# Patient Record
Sex: Male | Born: 1985 | Hispanic: Yes | Marital: Single | Smoking: Current every day smoker
Health system: Southern US, Community
[De-identification: ages and names within clinical notes are randomized; demographics above are authoritative.]

## PROBLEM LIST (undated history)

## (undated) DIAGNOSIS — E785 Hyperlipidemia, unspecified: Secondary | ICD-10-CM

---

## 2014-09-15 ENCOUNTER — Encounter (HOSPITAL_COMMUNITY): Payer: Self-pay | Admitting: Emergency Medicine

## 2014-09-15 ENCOUNTER — Emergency Department (HOSPITAL_COMMUNITY)
Admission: EM | Admit: 2014-09-15 | Discharge: 2014-09-15 | Disposition: A | Discharge status: Home / Self Care | Payer: Self-pay | Attending: Emergency Medicine | Admitting: Emergency Medicine

## 2014-09-15 ENCOUNTER — Ambulatory Visit (INDEPENDENT_AMBULATORY_CARE_PROVIDER_SITE_OTHER): Payer: Self-pay | Admitting: Family Medicine

## 2014-09-15 ENCOUNTER — Emergency Department (HOSPITAL_COMMUNITY): Payer: Self-pay

## 2014-09-15 VITALS — BP 134/84 | HR 105 | Temp 99.1°F | Resp 20

## 2014-09-15 DIAGNOSIS — Z8639 Personal history of other endocrine, nutritional and metabolic disease: Secondary | ICD-10-CM | POA: Insufficient documentation

## 2014-09-15 DIAGNOSIS — R000 Tachycardia, unspecified: Secondary | ICD-10-CM

## 2014-09-15 DIAGNOSIS — R0602 Shortness of breath: Secondary | ICD-10-CM | POA: Insufficient documentation

## 2014-09-15 DIAGNOSIS — F14180 Cocaine abuse with cocaine-induced anxiety disorder: Secondary | ICD-10-CM

## 2014-09-15 DIAGNOSIS — R0789 Other chest pain: Secondary | ICD-10-CM | POA: Insufficient documentation

## 2014-09-15 DIAGNOSIS — Z720 Tobacco use: Secondary | ICD-10-CM | POA: Insufficient documentation

## 2014-09-15 DIAGNOSIS — F1410 Cocaine abuse, uncomplicated: Secondary | ICD-10-CM | POA: Insufficient documentation

## 2014-09-15 HISTORY — DX: Hyperlipidemia, unspecified: E78.5

## 2014-09-15 LAB — CBC WITH DIFFERENTIAL/PLATELET
BASOS ABS: 0 10*3/uL (ref 0.0–0.1)
Basophils Relative: 0 % (ref 0–1)
EOS ABS: 0 10*3/uL (ref 0.0–0.7)
Eosinophils Relative: 0 % (ref 0–5)
HCT: 41.2 % (ref 39.0–52.0)
HEMOGLOBIN: 14.9 g/dL (ref 13.0–17.0)
LYMPHS ABS: 1.4 10*3/uL (ref 0.7–4.0)
LYMPHS PCT: 11 % — AB (ref 12–46)
MCH: 28.2 pg (ref 26.0–34.0)
MCHC: 36.2 g/dL — ABNORMAL HIGH (ref 30.0–36.0)
MCV: 77.9 fL — AB (ref 78.0–100.0)
MONOS PCT: 10 % (ref 3–12)
Monocytes Absolute: 1.3 10*3/uL — ABNORMAL HIGH (ref 0.1–1.0)
NEUTROS PCT: 79 % — AB (ref 43–77)
Neutro Abs: 10.5 10*3/uL — ABNORMAL HIGH (ref 1.7–7.7)
PLATELETS: 245 10*3/uL (ref 150–400)
RBC: 5.29 MIL/uL (ref 4.22–5.81)
RDW: 12.3 % (ref 11.5–15.5)
WBC: 13.3 10*3/uL — AB (ref 4.0–10.5)

## 2014-09-15 LAB — I-STAT TROPONIN, ED: Troponin i, poc: 0 ng/mL (ref 0.00–0.08)

## 2014-09-15 LAB — BASIC METABOLIC PANEL
Anion gap: 9 (ref 5–15)
BUN: 16 mg/dL (ref 6–20)
CHLORIDE: 108 mmol/L (ref 101–111)
CO2: 21 mmol/L — ABNORMAL LOW (ref 22–32)
Calcium: 9.3 mg/dL (ref 8.9–10.3)
Creatinine, Ser: 0.85 mg/dL (ref 0.61–1.24)
GFR calc Af Amer: >60 mL/min (ref >60)
GLUCOSE: 103 mg/dL — AB (ref 65–99)
POTASSIUM: 3.4 mmol/L — AB (ref 3.5–5.1)
Sodium: 138 mmol/L (ref 135–145)

## 2014-09-15 MED ORDER — DIAZEPAM 5 MG/ML IJ SOLN: 10.0000 mg | Freq: Once | INTRAMUSCULAR | Status: AC

## 2014-09-15 NOTE — ED Provider Notes (Signed)
CSN: [id number]     Arrival date & time 09/15/14  1600 History   First MD Initiated Contact with Patient 09/15/14 1609     Chief Complaint  Patient presents with  . Anxiety     (Consider location/radiation/quality/duration/timing/severity/associated sxs/prior Treatment) HPI Patient is a 29 year old male with past medical history of hyperlipidemia who presents the ER after being sent from urgent care. Patient initially went to urgent care complaining of tingling sensation in his fingers and toes. Patient states he used cocaine last night around 1 AM, and did not sleep through the night. Patient states around 10 or 11 this morning he "became ill, began to notice pain in his fingers, feet, legs and arms. Patient reports associated cramping sensation of all extremities. Patient states he also noticed associated tightness in his chest, and felt as though he was having difficulty breathing. Patient received IV Valium at the [facility] urgent care, and was sent to the ER for further evaluation. During my exam patient states he is "feeling much better". Patient states he is still only experiencing some mild tingling sensation in his fingers bilaterally. Patient denies nausea, vomiting, abdominal pain, fever.   Past Medical History  Diagnosis Date  . Hyperlipemia    History reviewed. No pertinent past surgical history. No family history on file. History  Substance Use Topics  . Smoking status: Current Every Day Smoker -- 0.50 packs/day    Types: Cigarettes  . Smokeless tobacco: Not on file  . Alcohol Use: Yes     Comment: "just a little"     Review of Systems  Constitutional: Negative for fever.  HENT: Negative for trouble swallowing.   Eyes: Negative for visual disturbance.  Respiratory: Positive for chest tightness and shortness of breath.   Cardiovascular: Negative for chest pain.  Gastrointestinal: Negative for nausea, vomiting and abdominal pain.  Genitourinary: Negative for dysuria.   Musculoskeletal: Negative for neck pain.  Skin: Negative for rash.  Neurological: Negative for dizziness, weakness and numbness.  Psychiatric/Behavioral: Negative.       Allergies  Review of patient's allergies indicates no known allergies.  Home Medications   Prior to Admission medications   Not on File   BP 132/76 mmHg  Pulse 72  Temp(Src) 98 F (36.7 C) (Oral)  Resp 19  SpO2 100% Physical Exam  Constitutional: He is oriented to person, place, and time. He appears well-developed and well-nourished. No distress.  HENT:  Head: Normocephalic and atraumatic.  Mouth/Throat: Oropharynx is clear and moist. No oropharyngeal exudate.  Eyes: Right eye exhibits no discharge. Left eye exhibits no discharge. No scleral icterus.  Neck: Normal range of motion.  Cardiovascular: Normal rate, regular rhythm and normal heart sounds.   No murmur heard. Pulmonary/Chest: Effort normal and breath sounds normal. No respiratory distress.  Abdominal: Soft. There is no tenderness.  Musculoskeletal: Normal range of motion. He exhibits no edema or tenderness.  Neurological: He is alert and oriented to person, place, and time. No cranial nerve deficit. Coordination normal.  Skin: Skin is warm and dry. No rash noted. He is not diaphoretic.  Psychiatric: He has a normal mood and affect.    ED Course  Procedures (including critical care time) Labs Review Labs Reviewed  CBC WITH DIFFERENTIAL/PLATELET - Abnormal; Notable for the following:    WBC 13.3 (*)    MCV 77.9 (*)    MCHC 36.2 (*)    Neutrophils Relative % 79 (*)    Neutro Abs 10.5 (*)    Lymphocytes Relative  11 (*)    Monocytes Absolute 1.3 (*)    All other components within normal limits  BASIC METABOLIC PANEL - Abnormal; Notable for the following:    Potassium 3.4 (*)    CO2 21 (*)    Glucose, Bld 103 (*)    All other components within normal limits  I-STAT TROPOININ, ED    Imaging Review Dg Chest 2 View  09/15/2014    CLINICAL DATA:  Two day history of chest pain with mild shortness of breath  EXAM: CHEST  2 VIEW  COMPARISON:  None.  FINDINGS: Lungs are clear. Heart size and pulmonary vascularity are normal. No adenopathy. No pneumothorax. No bone lesions. Several air-fluid levels are noted in the visualized bowel gas region.  IMPRESSION: No edema or consolidation. Air-fluid levels in the upper abdominal bowel. Question underlying ileus or enteritis.   Electronically Signed   By: Bretta Bang III M.D.   On: 09/15/2014 17:55     EKG Interpretation   Date/Time:  Saturday September 15 2014 16:07:20 EDT Ventricular Rate:  78 PR Interval:  134 QRS Duration: 95 QT Interval:  371 QTC Calculation: 423 R Axis:   35 Text Interpretation:  Sinus rhythm ST elevation, consider anterolateral  injury vs early repolarization No previous tracing Confirmed by KNAPP   MD-J, JON ([id number]) on 09/15/2014 6:44:26 PM      MDM   Final diagnoses:  Chest tightness  Atypical chest pain  Cocaine abuse    Patient here with mild signs and symptoms of tingling sensation in his fingers after using cocaine last night. Patient was getting Valium at the urgent care prior to being transferred here. Patient states feeling much better after the Valium. Patient describing some chest discomfort earlier in the day today. Patient denying any shortness of breath or chest pain here. EKG shows evidence of early repolarization. This along with patient's age, risk factors and the fact the patient does not have any chest pain or shortness of breath throughout his stay here, likely benign early repolarization.. Troponin 0 which is greater than 6 hours after onset of symptoms. Patient has heart score less than 3. Chest x-ray without any evidence of injury or ectopy. Patient is PERC negative. Likely patient signs and symptoms all related to his use of cocaine. There is no evidence of end organ damage throughout ER stay here. Patient asymptomatic by the end  of his stay. Patient afebrile, hemodynamically stable and in no acute distress. Patient denies suicidal or homicidal ideation. Patient given outpatient resources, stable for discharge, strongly encouraged to follow up with PCP. Return precautions discussed.   BP 132/76 mmHg  Pulse 72  Temp(Src) 98 F (36.7 C) (Oral)  Resp 19  SpO2 100%  Signed,  Ladona Mow, PA-C 1:08 AM  Patient discussed with Dr. Linwood Dibbles, MD   Ladona Mow, PA-C 09/16/14 [id number]  Linwood Dibbles, MD 09/17/14 1230

## 2014-09-15 NOTE — ED Notes (Signed)
Per EMS pt transferred here from [facility] for evaluation of anxiety. Pt use of ETOH and cocaine at 2300 last night; given 10 mg valium IV at Urgent Care. NSR en route.

## 2014-09-15 NOTE — Patient Instructions (Signed)
EMS take patient to [facility]

## 2014-09-15 NOTE — ED Notes (Signed)
Per [facility] via translator phone pt reports cocaine and alcohol use last night "but just a little;" felt funny last night while drinking so went home; went to work today "felt weird and forgetting things there" so went to doctor to be seen. Pt denies pain at present time and confirms was sent from urgent care to be evaluated for his anxiety.

## 2014-09-15 NOTE — ED Notes (Signed)
Bed: LX72 Expected date:  Expected time:  Means of arrival:  Comments: EMS/65M/cocaine and etoh use

## 2014-09-15 NOTE — Progress Notes (Signed)
  Subjective:  Patient ID: George Wolfe, male    DOB: 12-19-1985  Age: 29 y.o. MRN: [id number]  29 year old male, Hispanic, so does not speak hardly any [place]. He was brought in here by his friends. Last night the patient used cocaine at about 11 PM. He had a few beers. He was very agitated and his friends brought him here. He is nervous and complains of pain and spasm in both his arms and both of his legs. He has had a little bit of chest pain. No nausea or vomiting. He decided today except for he had some milk. Eyes he has been healthy.   Objective:   Agitated tense look on his face. Flushed with reddened face and extremities. Warm to touch in his extremities. Temperature was only 99.2. He speaks [place]. His friends interpreted. He denies major problems other than hurting in his arms and legs. When asked about chest pain he said he had a small amount. He is alert. Eyes PERRLA. Conjunctivae injected. Throat clear. Neck supple without nodes. Chest clear to auscultation. Heart regular without murmurs. Abdomen soft. Extremities are being held fairly rigid IM.  Assessment & Plan:   Assessment: Cocaine abuse with acute cocaine-induced anxiety Arm and leg pain Mild chest pain  Plan: IV was begun on the patient and he was given 5 mg of IV Valium, repeated in about 4 minutes. Total of 10 mg IV push Valium. EMS arrived at about that time and they will take the patient on to the emergency room at [facility] for further management.   Patient Instructions  EMS take patient to [facility]     Georgiann Neider, MD 09/15/2014

## 2014-09-15 NOTE — Discharge Instructions (Signed)
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 La cocana tiene usos mdicos, como Photographer sangrado nasal y Human resources officer antes de una ciruga menor odontolgica o nasal. Sin embargo, tambin se [place] con fines indebidos por los [employer] produce. Estos efectos incluyen lo siguiente:   Environmental consultant extremo.  Estado de [place].  Mucha energa. Los nombres ms comunes que se usan para denominar a la cocana incluyen coca, crack, polvo, nieve y [place]. La cocana se inhala, se disuelve en agua y se inyecta o se fuma.  Los estimulantes son Lawson Fiscal porque [place] regiones del cerebro que producen tanto la sensacin placentera de "recompensa" como la dependencia psicolgica. En conjunto, estas acciones son responsables de que el consumidor pierda el control y rpidamente comience a depender de la droga. Esto significa que el consumidor se sentir enfermo si no la recibe (abstinencia) y necesitar seguir utilizndola para funcionar.  El trastorno por consumo de estimulantes es el uso de estimulantes que interfiere en la vida cotidiana. Afecta las relaciones con familiares y [place], y tambin su desempeo en el [place]. La cocana aumenta la presin arterial y la frecuencia cardaca. Puede llevar a un infarto de miocardio o un ictus. Tambin puede causar la muerte por frecuencia cardaca irregular o convulsiones. SNTOMAS Los sntomas del trastorno por consumo de cocana incluyen lo siguiente:  Consumo de cocana en cantidades mayores o durante perodos ms prolongados de lo previsto.  Intentos fallidos de reducir o controlar el consumo de cocana.  Prdida de mucho tiempo para Barista, consumir o recuperarse de los efectos de la cocana.  Una necesidad o deseo intenso de consumir cocana (impulso).  Consumo continuo de cocana a pesar de los problemas en  el trabajo, en la escuela o en el hogar debido a dicho consumo.  Consumo continuo de cocana a Scientist, water quality de los [employer] debido a dicho consumo.  Abandono o disminucin de importantes actividades cotidianas debido al consumo de cocana.  Consumo repetido de cocana incluso en situaciones de peligro fsico, como al conducir un automvil.  Consumo continuo de cocana a pesar de un problema fsico que probablemente est relacionado con dicho consumo. Los problemas fsicos incluyen lo siguiente:  Desnutricin.  Hemorragias nasales.  Dolor en el pecho.  Hipertensin arterial.  Formacin de un orificio en la parte de la nariz que separa los orificios nasales (perforacin del tabique nasal).  Dao pulmonar y renal.  Consumo continuo de cocana a pesar de un problema mental que probablemente est relacionado con dicho consumo. Entre los problemas mentales se incluyen los siguientes:  Sntomas similares a la esquizofrenia.  Depresin.  Cambios bipolares en el estado de nimo.  Ansiedad.  Problemas para dormir.  Necesidad de consumir ms cocana para Writer, o Tax inspector con la misma cantidad a medida que pasa el tiempo (tolerancia).  Aparicin de sntomas de abstinencia al interrumpir el consumo de cocana, que se intenta reducir o evitar consumiendo nuevamente. Entre los sntomas de abstinencia se incluyen los siguientes:  Depresin o irritabilidad.  Energa baja o inquietud.  Pesadillas.  Dormir poco o Press photographer.  Aumento del apetito. DIAGNSTICO El mdico es quien diagnostica el trastorno por consumo de estimulantes. Pueden hacerle preguntas sobre el consumo de cocana y las consecuencias en su vida. Es posible que le hagan un examen fsico, adems de indicarle una prueba de deteccin de drogas. Pueden derivarlo  a un profesional de salud mental. El diagnstico del trastorno por consumo de estimulantes requiere la presencia de al [employer]  sntomas en . El tipo de trastorno por consumo de estimulantes depende de la cantidad de signos y sntomas presentes. El tipo de trastorno se define por estas caractersticas:  Leve. Dos o tres signos y sntomas.  Moderado. Cuatro o cinco signos y sntomas.  Grave. Seis o ms signos y sntomas. TRATAMIENTO Por lo general, los profesionales de salud mental capacitados en trastornos por abuso de sustancias son quienes brindan tratamiento para el trastorno por consumo de estimulantes. Las opciones disponibles son las siguientes:  Asesoramiento psicolgico o psicoterapia. La psicoterapia aborda las razones del consumo de cocana y las formas de Automotive engineer que el paciente Education officer, community. Los objetivos de la psicoterapia incluyen lo siguiente:  Teaching laboratory technician y Automotive engineer los factores que desencadenan el consumo.  Controlar el deseo intenso de consumir.  Reemplazar el consumo por actividades saludables.  Grupos de apoyo. Estos grupos brindan apoyo emocional, consejos y Optometrist.  Medicamentos. Ciertos medicamentos pueden disminuir el deseo intenso de consumir cocana o los sntomas de abstinencia. INSTRUCCIONES PARA EL CUIDADO EN EL HOGAR  Tome los medicamentos solamente como se lo haya indicado el mdico.  Identifique a las personas y las actividades que desencadenan el consumo de [place], y Chief of Staff.  Concurra a todas las visitas de control como se lo haya indicado el mdico. SOLICITE ATENCIN MDICA SI:  Los sntomas empeoran o tiene una recada.  No puede tomar los [employer]ée [employer]uder han indicado. SOLICITE ATENCIN MDICA DE INMEDIATO SI:  Tiene pensamientos serios acerca de lastimarse a usted mismo o daar a Economist.  Tiene una convulsin, dolor en el pecho, debilidad repentina o prdida del habla o la visin. Irven Shelling MS INFORMACIN  [facility] el Abuso de [facility]rog[facility] on Drug Abuse): www.drugabuse.gov  Administracin de Servicios por Abuso  de Sustancias y Salud Mental (Substance Abuse and Mental Health Services Administration): SkateOasis.com.pt Document Released: 03/16/2005 Document Revised: 07/31/2013 [facility] Patient Information 2015 [place], [place]. This information is not intended to replace advice given to you by your health care provider. Make sure you discuss any questions you have with your health care provider.  Dolor de pecho (no especfico) (Chest Pain (Nonspecific)) Con frecuencia es difcil dar un diagnstico especfico de la causa del dolor de [place]. Siempre hay una posibilidad de que el dolor podra estar relacionado con algo grave, como un ataque al corazn o un cogulo sanguneo en los pulmones. Debe someterse a controles con el mdico para ms evaluaciones. CAUSAS   Acidez.  Neumona o bronquitis.  Ansiedad o estrs.  Inflamacin de la zona que rodea al corazn (pericarditis) o a los pulmones (pleuritis o pleuresa).  Un cogulo sanguneo en el pulmn.  Colapso de un pulmn (neumotrax), que puede aparecer de [employer] repentina por s solo (neumotrax espontneo) o debido a un traumatismo en el trax.  Culebrilla (virus del herpes zster). La pared torcica est compuesta por huesos, msculos y TEFL teacher. Cualquiera de estos puede ser la fuente del dolor.  Puede haber una contusin en los huesos debido a una lesin.  Puede haber un esguince en los msculos o el cartlago ocasionado por la tos o por [place].  El cartlago puede verse afectado por una inflamacin y Psychologist, counselling (costocondritis). DIAGNSTICO  Gretchen Short se necesiten anlisis de laboratorio u otros estudios para Veterinary surgeon causa del Engineer, mining. Adems, puede indicarle que se haga una prueba llamada electrocadiograma (ECG) ambulatorio.  El ECG registra los patrones de los latidos cardacos durante 24horas. Adems, pueden hacerle otros estudios, por ejemplo:  Ecocardiograma transtorcico (ETT). Durante Management consultant, se usan ondas  sonoras para evaluar el flujo de la sangre a travs del corazn.  Ecocardiograma transesofgico (ETE).  Monitoreo cardaco. Permite que el mdico controle la frecuencia y el ritmo cardaco en tiempo real.  Monitor Holter. Es un dispositivo porttil que [employer] latidos cardacos y [place] a Education administrator las arritmias cardacas. Le permite al [employer] registrar la actividad cardaca durante varios das, si es necesario.  Pruebas de estrs por ejercicio o por medicamentos que aceleran los latidos cardacos. TRATAMIENTO   El tratamiento depende de la causa del dolor de [place]. El tratamiento puede incluir:  Inhibidores de la acidez estomacal.  Antiinflamatorios.  Analgsicos para las enfermedades inflamatorias.  Antibiticos, si hay una infeccin.  Podrn aconsejarle que modifique su estilo de vida. Esto incluye dejar de fumar y evitar el alcohol, la cafena y el chocolate.  Pueden aconsejarle que mantenga la cabeza levantada (elevada) cuando duerme. Esto reduce la probabilidad de que el cido retroceda del estmago al esfago. En la [employer], el dolor de pecho no especfico mejorar en el trmino de 2 a 3das, con reposo y IAC[employer].  INSTRUCCIONES PARA EL CUIDADO EN EL HOGAR   Si le prescriben antibiticos, tmelos tal como se le indic. Termnelos aunque comience a sentirse mejor.  [address] no haga actividades fsicas que provoquen dolor de [place]. Contine con las actividades fsicas tal como se le indic  No consuma ningn producto que contenga tabaco, incluidos cigarrillos, tabaco de Theatre manager o cigarrillos electrnicos.  Evite el consumo de alcohol.  Tome los medicamentos solamente como se lo haya indicado el mdico.  Siga las sugerencias del mdico en lo que respecta a las pruebas adicionales, si el dolor de pecho no desaparece.  Concurra a todas las visitas de control programadas. Si no lo hace, podra desarrollar problemas permanentes  (crnicos) relacionados con el dolor. Si hay algn problema para concurrir a una cita, llame para reprogramarla. SOLICITE ATENCIN MDICA SI:   El dolor de pecho no desaparece, incluso despus del tratamiento.  Tiene una erupcin cutnea con ampollas en el pecho.  Tiene fiebre. SOLICITE ATENCIN MDICA DE [employer] SI:   Aumenta el dolor de pecho o este se irradia hacia el brazo, el cuello, la [place], la espalda o el abdomen.  Le falta el aire.  La tos empeora, o expectora sangre.  Siente dolor intenso en la espalda o el abdomen.  Se siente nauseoso o vomita.  Siente debilidad intensa.  Se desmaya.  Tiene escalofros. Esto es Radio broadcast assistant. No espere a ver si el dolor se pasa. Obtenga ayuda mdica de inmediato. Llame a los servicios de emergencia locales (911 en los [place]). No conduzca por sus propios medios [employer] hospital. ASEGRESE DE QUE:   Comprende estas instrucciones.  Controlar su afeccin.  Recibir ayuda de inmediato si no mejora o si empeora. Document Released: 03/16/2005 Document Revised: 03/21/2013 [facility] Patient Information 2015 [place], [place]. This information is not intended to replace advice given to you by your health care provider. Make sure you discuss any questions you have with your health care provider.   Emergency Department Resource Guide 1) Find a Doctor and Pay Out of Pocket Although you won't have to find out who is covered by your insurance plan, it is a good idea to ask around and get recommendations. You will then  need to call the office and see if the doctor you have chosen will accept you as a new patient and what types of options they offer for patients who are self-pay. Some doctors offer discounts or will set up payment plans for their patients who do not have insurance, but you will need to ask so you aren't surprised when you get to your appointment.  2) Contact Your Local Health Department Not all health departments  have doctors that can see patients for sick visits, but many do, so it is worth a call to see if yours does. If you don't know where your local health department is, you can check in your phone book. The CDC also has a tool to help you locate your state's health department, and many state websites also have listings of all of their local health departments.  3) Find a Walk-in Clinic If your illness is not likely to be very severe or complicated, you may want to try a walk in clinic. These are popping up all over the country in pharmacies, drugstores, and shopping centers. They're usually staffed by nurse practitioners or physician assistants that have been trained to treat common illnesses and complaints. They're usually fairly quick and inexpensive. However, if you have serious medical issues or chronic medical problems, these are probably not your best option.  No Primary Care Doctor: - Call Health Connect at  [phone] - they can help you locate a primary care doctor that  accepts your insurance, provides certain services, etc. - Physician Referral Service- [phone]  Chronic Pain Problems: Organization         Address  Phone   Notes  [facility]  [phone] Patients need to be referred by their primary care doctor.   Medication Assistance: Organization         Address  Phone   Notes  [facility] Medication [facility] [address] [place] [place], [place] [address] [phone] --Must be a resident of [facility] -- Must have NO insurance coverage whatsoever (no Medicaid/ Medicare, etc.) -- The pt. MUST have a primary care doctor that directs their care regularly and follows them in the community   MedAssist  [phone]   [employer]  [phone]    Agencies that provide inexpensive medical care: Organization         Address  Phone   Notes  [facility]  [phone]   [facility]    [phone]    [facility] [address] [place], [place] [address] [phone]   [facility] [address] [place], [place] [phone]   Planned Parenthood    [phone]   [facility]    [phone]   [facility]  [address], [place] Phone:  [phone], Fax:  [phone] Hours of Operation:  9 am - 6 pm, M-F.  Also accepts Medicaid/Medicare and self-pay.  [facility]  [address], [place] Phone: [phone], Fax: [phone]. Hours of Operation:  8:30 am - 5:30 pm, M-F.  Also accepts Medicaid and self-pay.  [facility] [place] [address], [place] Phone: [phone]   [facility] [address] Natasha Bence [place], [place] [phone], Ext. [phone] Mondays & Thursdays: 7-9 AM.  First 15 patients are seen on a first come, first serve basis.  Medicaid-accepting [facility] Providers:  Organization         Address  Phone   Notes  [facility] [address], [place] [phone] Also accepts self-pay patients.  [facility] [address] Laurell Josephs [place], [place]  [phone]   [facility] [address], [place] [phone]   [facility] [address], [place] [phone]   Renaye Rakers [address], [place]   [phone] Only accepts [place] Access [place] patients after they have their name applied to their card.   Self-Pay (no insurance) in [facility]:  Organization         Address  Phone   Notes  Sickle Cell Patients, [facility] [address] [place], [place] [phone]   [facility] [address] [place], [place] [phone]   [facility] [place]  [address], [place] [phone]   [facility]/Dr. Osei-Bonsu  [address] [place] or [address], [place] [phone] Phone number for both [place] and [place] locations is the same.  [facility] [address], [place] [phone]   [facility] [address], [place] or [address] [phone]   [facility] [address], [place] [phone], phone; [phone], fax Sees patients 1st and 3rd Saturday of every month.  Must not qualify for public or private insurance (i.e. Medicaid, Medicare, [place] Health Choice, Veterans' Benefits)  Household income should be no more than 200% of the poverty level The clinic cannot treat you if you are pregnant or think you are pregnant  Sexually transmitted diseases are not treated at the clinic.    Dental Care: Organization         Address  Phone  Notes  [facility] Department of [facility] [address] [place], [place] [phone] Accepts children up to age 79 who are enrolled in [place] or [place] Health Choice; pregnant women with a Medicaid card; and children who have applied for Medicaid or [place] Health Choice, but were declined, whose parents can pay a reduced fee at time of service.  [facility] Department of [facility]  [address], [place] [phone] Accepts children up to age 2 who are enrolled in [place] or [place] Health Choice; pregnant women with a Medicaid card; and children who have applied for Medicaid or [place] Health Choice, but were declined, whose parents can pay a reduced fee at time of service.  [facility]  [address] [place], [place] [phone] Patients are seen by appointment only. Walk-ins are not accepted. [facility] will see patients 48 years of age and older. Monday - Tuesday (8am-5pm) Most Wednesdays (8:30-5pm) $30 per visit, cash only  [facility]  Adult Dental Access PROGRAM  [address], [facility] [phone] Patients are seen by appointment only. Walk-ins are not accepted. [facility] will see patients 7 years of age and older. One Wednesday Evening (Monthly: Volunteer Based).  $30 per visit, cash only  [employer]  [phone] for adults; Children under age 72, call Graduate Pediatric Dentistry at [phone]. Children aged 27-14, please call [phone] to request a  pediatric application.  Dental services are provided in all areas of dental care including fillings, crowns and bridges, complete and partial dentures, implants, gum treatment, root canals, and extractions. Preventive care is also provided. Treatment is provided to both adults and children. Patients are selected via a lottery and there is often a waiting list.   [facility] [address] [place]  [phone] [email or web address]   [facility] [address] [place], [place] [phone], Ext. [phone] Second and Fourth Thursday of each month, opens at 6:30 AM; Clinic ends at 9 AM.  Patients are seen on a first-come first-served basis, and a limited number are seen during each clinic.   [facility]  [address] Ether Griffins [place], [place] [phone]   Eligibility Requirements You must have lived in [place], [place], or [place] counties for at least the last three months.   You cannot be eligible for state or federal sponsored [place], including [employer]IG[employer], [place], or [employer].   You generally cannot be eligible for healthcare insurance through your employer.    How to apply: Eligibility screenings are held every Tuesday and Wednesday afternoon from 1:00 pm until 4:00 pm. You do not need an appointment for the interview!  [facility] [address], [place], [place] [phone]   [facility] Health Department  [phone]   [facility] Health Department  [phone]   [facility] Health Department  [phone]    Behavioral Health Resources in the Community: Intensive Outpatient Programs Organization         Address  Phone  Notes  [facility] [address], [place], [place] [phone]   [facility] Outpatient [address], [place], [place] [phone]   [facility] [address] [place], [place]  [phone]   [facility] Mental Health [address]  [place], [place] [phone] or [phone]   Substance Abuse Resources Organization         Address  Phone  Notes  Al[facility] Drug Services  [phone]   [facility]  [phone]   [facility] [place]  [phone]   Floydene Flock  [phone]   Residential & Outpatient Substance Abuse Program  [phone]   Psychological Services Organization         Address  Phone  Notes  [facility] Behavioral Health  [phone]   [facility] Services  [phone]   [facility] Mental Health [address] [place] [phone] or [phone]    Mobile Crisis Teams Organization         Address  Phone  Notes  [facility], Mobile Crisis Care Unit  [phone]   [facility]  [address] [place], [place] [phone]   Doristine Locks [address] [place] [phone]    Self-Help/Support Groups Organization         Address  Phone             Notes  [facility] [place] - variety of support groups  336- I7437963 Call for more information  Narcotics Anonymous (NA), [facility] [address], [employer] [place]  2 meetings at this location   Statistician         Address  Phone  Notes  [facility] [address],    [place]  [phone]   [facility]  [address], [place] [address],  [place], [place] [phone]   [facility]  Facility [address] [place], [place] [phone] Admissions: 8am-3pm M-F  [facility] [address]    [place], [place] [phone]   [facility] [address] [place], [place], [place] [phone]   [facility] [address],  [place], [place] [phone]   [facility] - Intensive Outpatient [address] Laure[address], [place], [place] [phone]   [facility] ([facility] [address] [place]  [place], [place] [phone] or [phone]   [facility] ([facility]) [address], [place], [place] [phone] Accepts Medicaid  [facility] [address],  [place] [phone] Substance Abuse/Addiction Treatment   [facility] Organization         Address  Phone  Notes  [facility]  [phone]   Angie Fava, PhD [address] Ervin Knack [place], [place]   [phone] or [phone]   [facility] Behavioral   [address] [place], [place] [phone]   [facility] [address] [place], [place] [phone] Insurance/Medicaid/sponsorship through [employer] and Families [address]                                    [place], [place] [phone] Therapy/tele-psych/case  [facility] [address] [place] [address], [place] [phone]    Dr. Lolly Mustache  [phone]   [facility] [place]  [employer] [facility] Dept. 1) [address] [place] 2) [address] [place] 3)  [address], [place] [phone]  [phone]   [facility] Child Abuse Hotline [phone] or [phone] (After Hours)

## 2016-06-07 IMAGING — CR DG CHEST 2V
2 series · 2 of 2 positions shown · non-contrast
Comparison: None.

CLINICAL DATA: Two day history of chest pain with mild shortness of
breath

EXAM:
CHEST  2 VIEW

[w chest pa]
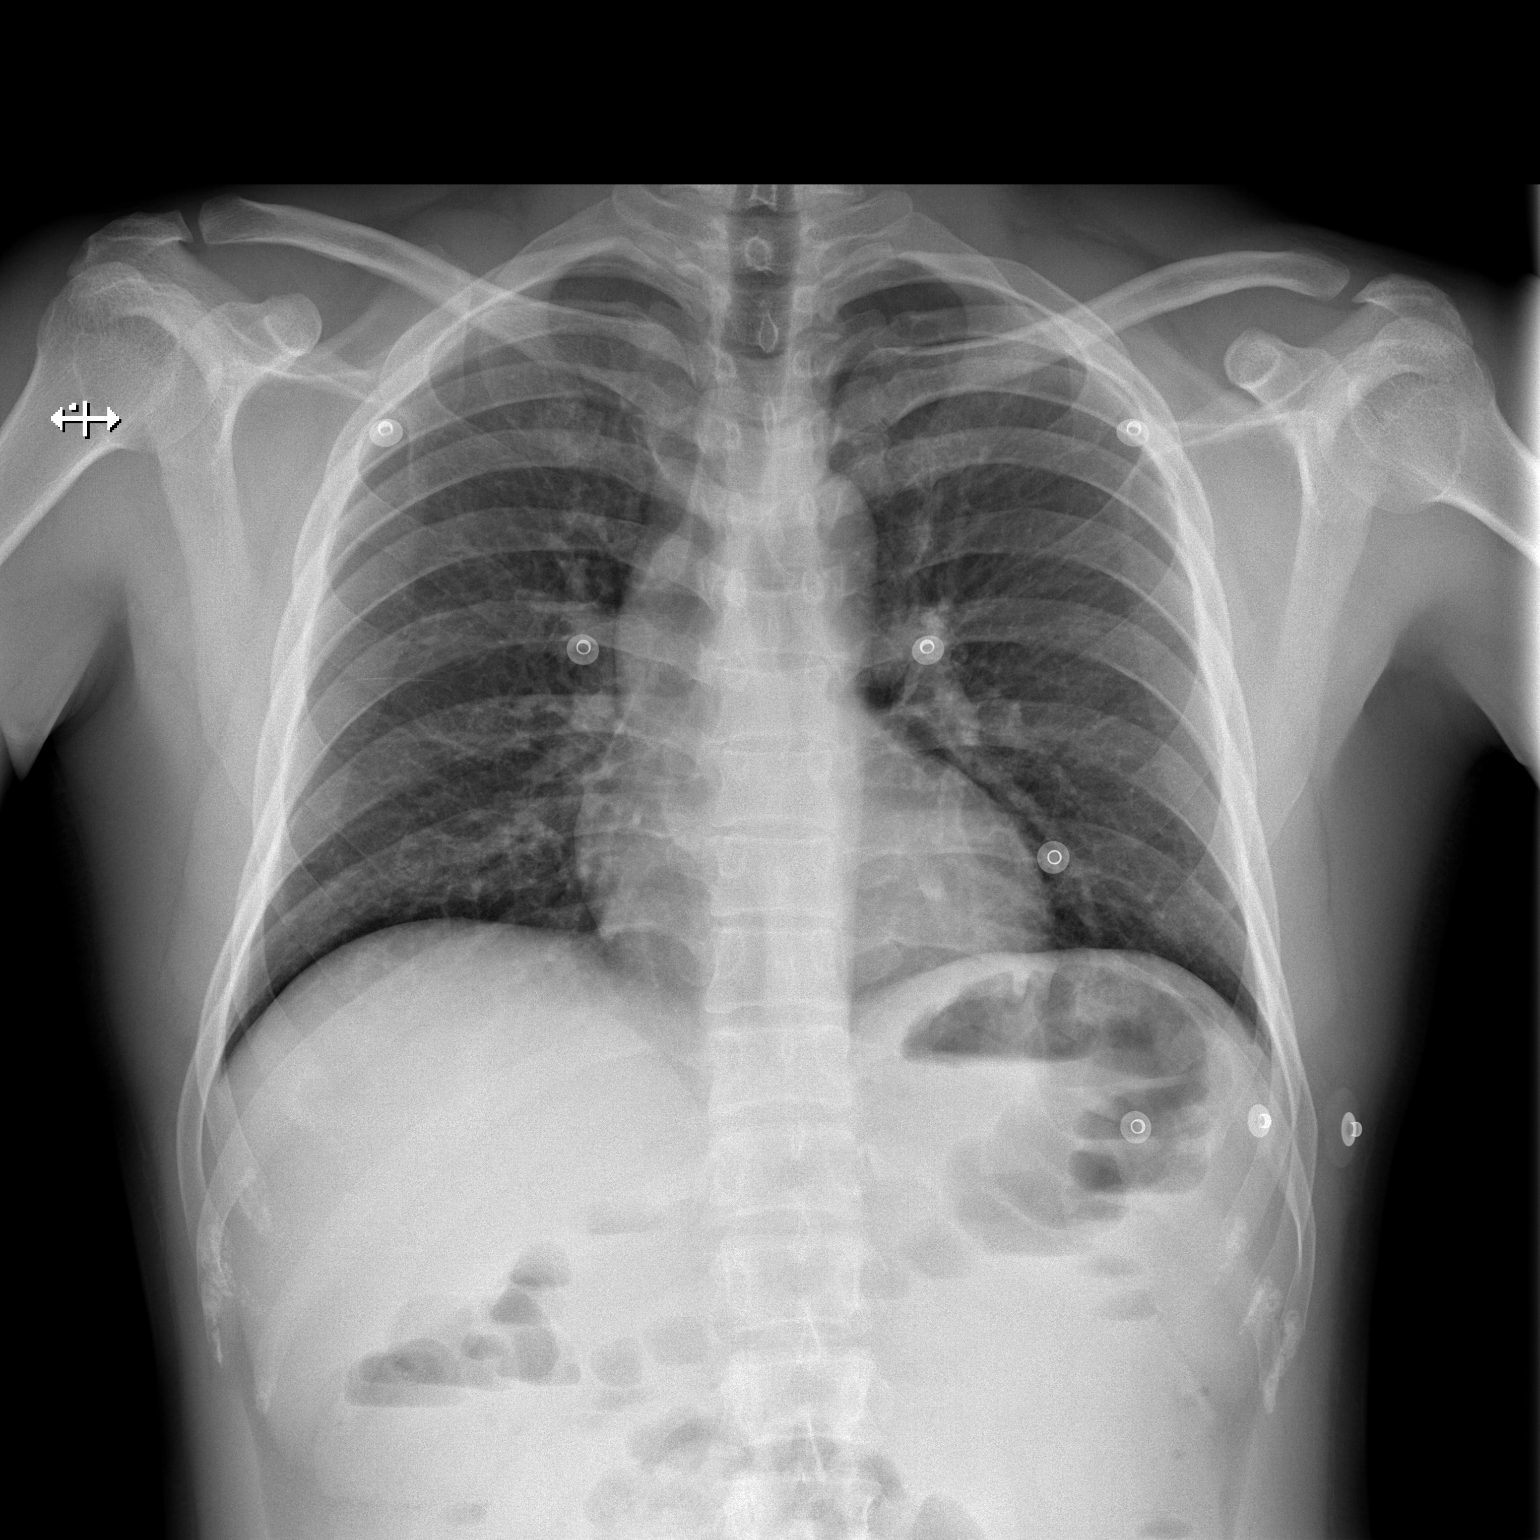

[w chest lat]
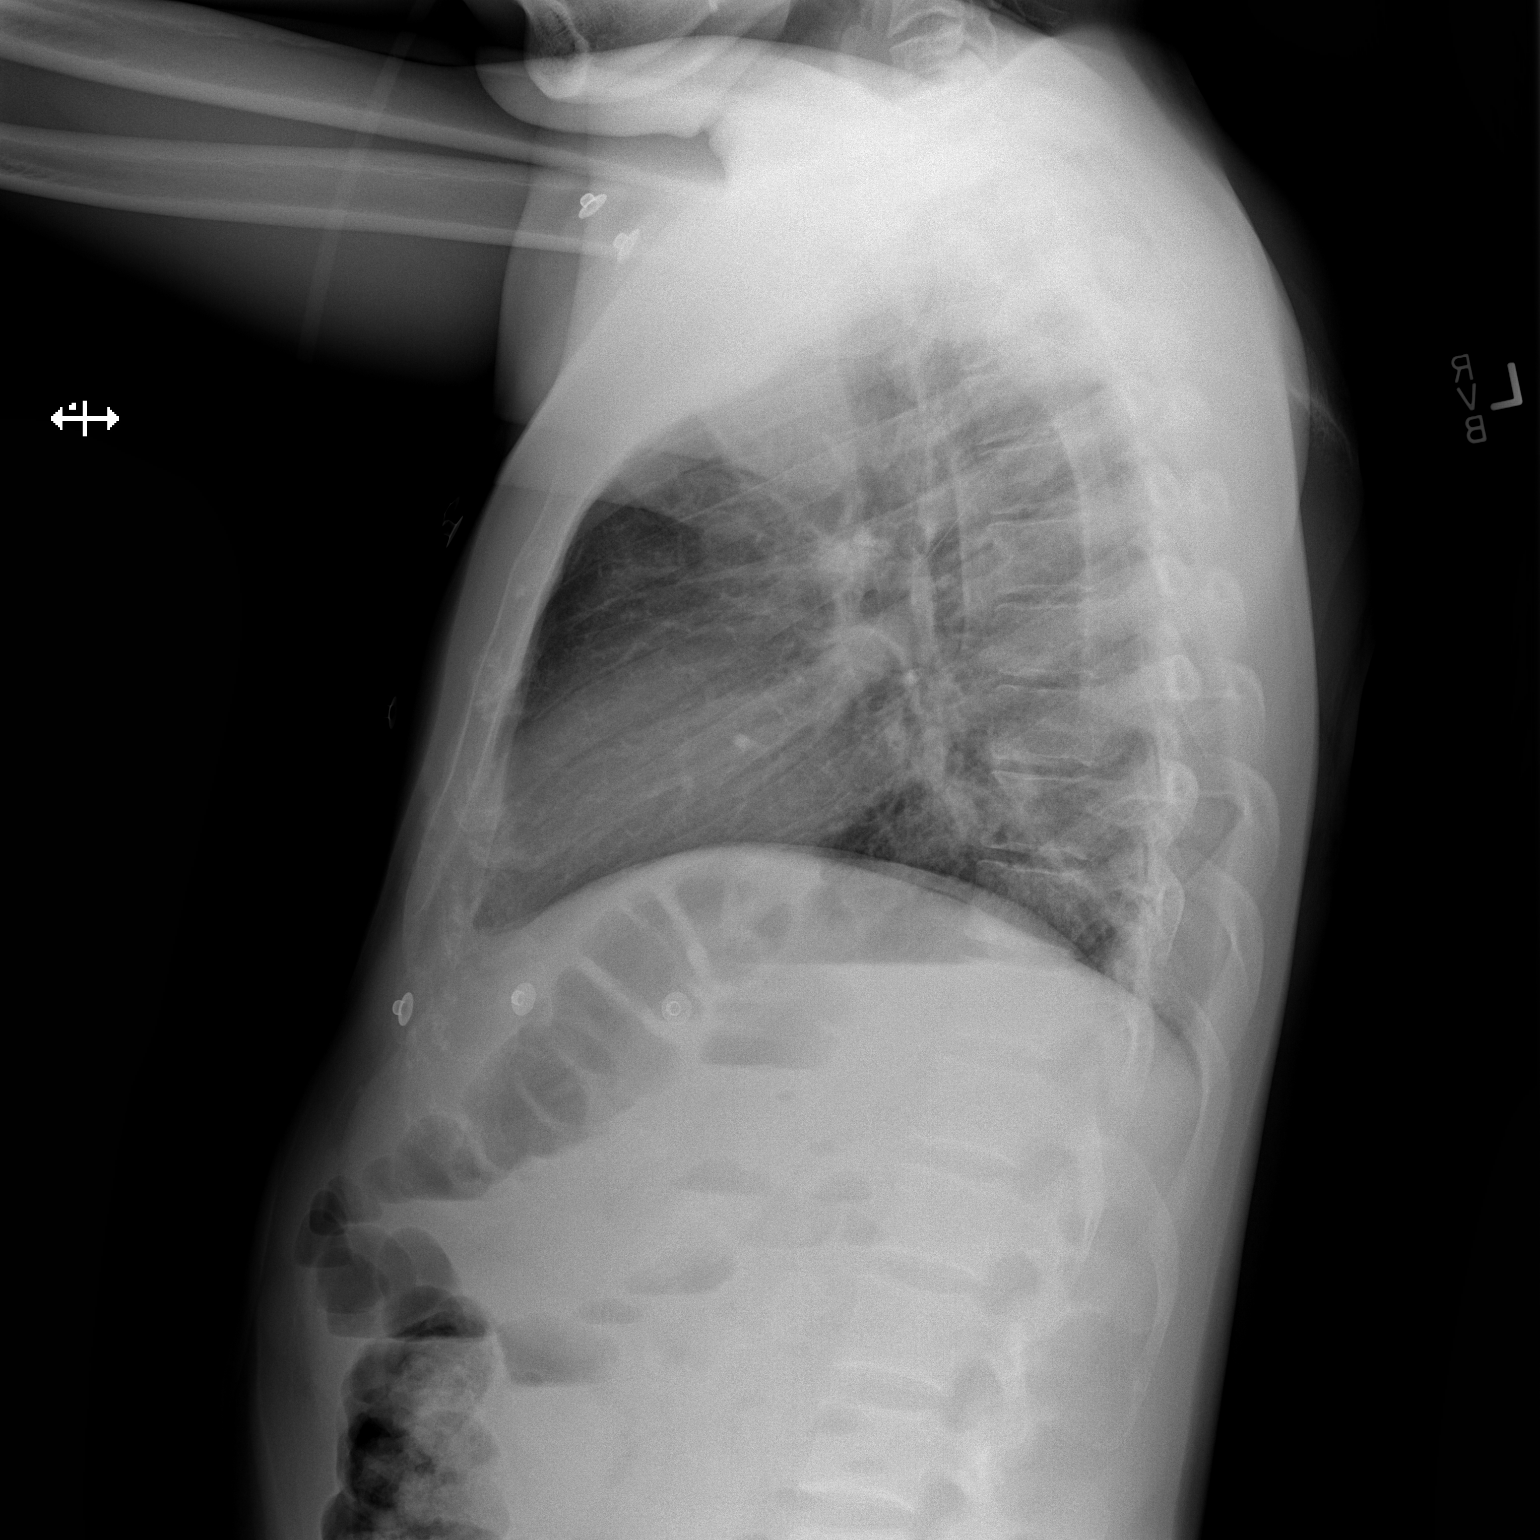

[2 of 2 positions shown; findings below may reference images not displayed]

FINDINGS: Lungs are clear. Heart size and pulmonary vascularity are normal. No
adenopathy. No pneumothorax. No bone lesions. Several air-fluid
levels are noted in the visualized bowel gas region.
IMPRESSION: No edema or consolidation. Air-fluid levels in the upper abdominal
bowel. Question underlying ileus or enteritis.
